# Patient Record
Sex: Female | Born: 1975 | Race: White | Hispanic: No | Marital: Married | Smoking: Former smoker
Health system: Southern US, Community
[De-identification: ages and names within clinical notes are randomized; demographics above are authoritative.]

## PROBLEM LIST (undated history)

## (undated) DIAGNOSIS — F32A Depression, unspecified: Secondary | ICD-10-CM

## (undated) DIAGNOSIS — K3184 Gastroparesis: Secondary | ICD-10-CM

## (undated) DIAGNOSIS — I2699 Other pulmonary embolism without acute cor pulmonale: Secondary | ICD-10-CM

## (undated) DIAGNOSIS — M810 Age-related osteoporosis without current pathological fracture: Secondary | ICD-10-CM

## (undated) DIAGNOSIS — S22000A Wedge compression fracture of unspecified thoracic vertebra, initial encounter for closed fracture: Secondary | ICD-10-CM

## (undated) DIAGNOSIS — F329 Major depressive disorder, single episode, unspecified: Secondary | ICD-10-CM

## (undated) DIAGNOSIS — F419 Anxiety disorder, unspecified: Secondary | ICD-10-CM

## (undated) DIAGNOSIS — J189 Pneumonia, unspecified organism: Secondary | ICD-10-CM

## (undated) DIAGNOSIS — G43909 Migraine, unspecified, not intractable, without status migrainosus: Secondary | ICD-10-CM

## (undated) DIAGNOSIS — N289 Disorder of kidney and ureter, unspecified: Secondary | ICD-10-CM

## (undated) DIAGNOSIS — D6861 Antiphospholipid syndrome: Secondary | ICD-10-CM

## (undated) HISTORY — PX: OTHER SURGICAL HISTORY: SHX169

## (undated) HISTORY — PX: CYST REMOVAL TRUNK: SHX6283

## (undated) HISTORY — PX: INSERT / REPLACE PERIPHERAL NEUROSTIMULATOR PULSE GENERATOR / RECEIVER: SUR715

## (undated) HISTORY — PX: EYE SURGERY: SHX253

## (undated) HISTORY — PX: ABDOMINAL HYSTERECTOMY: SHX81

## (undated) HISTORY — PX: TONSILLECTOMY: SUR1361

---

## 2009-11-18 ENCOUNTER — Ambulatory Visit: Payer: Self-pay | Admitting: Diagnostic Radiology

## 2009-11-18 ENCOUNTER — Emergency Department (HOSPITAL_BASED_OUTPATIENT_CLINIC_OR_DEPARTMENT_OTHER): Admission: EM | Admit: 2009-11-18 | Discharge: 2009-11-18 | Payer: Self-pay | Admitting: Emergency Medicine

## 2010-05-02 LAB — URINALYSIS, ROUTINE W REFLEX MICROSCOPIC
Glucose, UA: NEGATIVE mg/dL
Nitrite: NEGATIVE
Protein, ur: 100 mg/dL — AB
pH: 8 (ref 5.0–8.0)

## 2010-05-02 LAB — CULTURE, BLOOD (ROUTINE X 2): Culture: NO GROWTH

## 2010-05-02 LAB — DIFFERENTIAL
Basophils Absolute: 0 10*3/uL (ref 0.0–0.1)
Basophils Relative: 0 % (ref 0–1)
Eosinophils Absolute: 0.5 10*3/uL (ref 0.0–0.7)
Eosinophils Relative: 8 % — ABNORMAL HIGH (ref 0–5)
Lymphocytes Relative: 30 % (ref 12–46)
Lymphs Abs: 1.6 10*3/uL (ref 0.7–4.0)
Monocytes Absolute: 0.5 10*3/uL (ref 0.1–1.0)
Monocytes Relative: 9 % (ref 3–12)
Neutro Abs: 3 10*3/uL (ref 1.7–7.7)
Neutrophils Relative %: 53 % (ref 43–77)

## 2010-05-02 LAB — COMPREHENSIVE METABOLIC PANEL
Albumin: 4.1 g/dL (ref 3.5–5.2)
Alkaline Phosphatase: 85 U/L (ref 39–117)
BUN: 14 mg/dL (ref 6–23)
Calcium: 10.1 mg/dL (ref 8.4–10.5)
Creatinine, Ser: 0.9 mg/dL (ref 0.4–1.2)
Glucose, Bld: 86 mg/dL (ref 70–99)
Potassium: 4.3 mEq/L (ref 3.5–5.1)
Total Protein: 7.5 g/dL (ref 6.0–8.3)

## 2010-05-02 LAB — URINE CULTURE
Colony Count: NO GROWTH
Culture  Setup Time: 201110022005
Culture: NO GROWTH

## 2010-05-02 LAB — CBC
MCHC: 33.2 g/dL (ref 30.0–36.0)
MCV: 84.4 fL (ref 78.0–100.0)
Platelets: 259 10*3/uL (ref 150–400)
RDW: 12 % (ref 11.5–15.5)
WBC: 5.6 10*3/uL (ref 4.0–10.5)

## 2010-05-02 LAB — PROTIME-INR: INR: 0.89 (ref 0.00–1.49)

## 2010-05-02 LAB — URINE MICROSCOPIC-ADD ON

## 2010-05-22 DIAGNOSIS — S88119A Complete traumatic amputation at level between knee and ankle, unspecified lower leg, initial encounter: Secondary | ICD-10-CM

## 2010-05-22 DIAGNOSIS — G609 Hereditary and idiopathic neuropathy, unspecified: Secondary | ICD-10-CM

## 2010-05-22 DIAGNOSIS — I70269 Atherosclerosis of native arteries of extremities with gangrene, unspecified extremity: Secondary | ICD-10-CM

## 2010-05-22 DIAGNOSIS — G547 Phantom limb syndrome without pain: Secondary | ICD-10-CM

## 2010-08-29 ENCOUNTER — Other Ambulatory Visit: Payer: Self-pay | Admitting: Neurology

## 2010-08-29 DIAGNOSIS — H93A9 Pulsatile tinnitus, unspecified ear: Secondary | ICD-10-CM

## 2010-08-29 DIAGNOSIS — R519 Headache, unspecified: Secondary | ICD-10-CM

## 2010-09-02 ENCOUNTER — Ambulatory Visit
Admission: RE | Admit: 2010-09-02 | Discharge: 2010-09-02 | Disposition: A | Discharge status: Home / Self Care | Payer: BC Managed Care – PPO | Source: Ambulatory Visit | Attending: Neurology | Admitting: Neurology

## 2010-09-02 DIAGNOSIS — R519 Headache, unspecified: Secondary | ICD-10-CM

## 2010-09-02 DIAGNOSIS — H93A9 Pulsatile tinnitus, unspecified ear: Secondary | ICD-10-CM

## 2010-09-02 MED ORDER — GADOBENATE DIMEGLUMINE 529 MG/ML IV SOLN: 13.0000 mL | Freq: Once | INTRAVENOUS | Status: AC | PRN

## 2010-09-11 ENCOUNTER — Other Ambulatory Visit: Payer: Self-pay | Admitting: Neurology

## 2010-09-11 DIAGNOSIS — R519 Headache, unspecified: Secondary | ICD-10-CM

## 2010-09-13 ENCOUNTER — Other Ambulatory Visit: Payer: BC Managed Care – PPO

## 2010-09-13 ENCOUNTER — Ambulatory Visit
Admission: RE | Admit: 2010-09-13 | Discharge: 2010-09-13 | Disposition: A | Discharge status: Home / Self Care | Payer: BC Managed Care – PPO | Source: Ambulatory Visit | Attending: Neurology | Admitting: Neurology

## 2010-09-13 DIAGNOSIS — R519 Headache, unspecified: Secondary | ICD-10-CM

## 2010-09-13 MED ORDER — DIAZEPAM 2 MG PO TABS
10.0000 mg | ORAL_TABLET | Freq: Once | ORAL | Status: AC
  Administered 2010-09-13: 10 mg via ORAL

## 2010-09-13 NOTE — Progress Notes (Signed)
Patient's mother at bedside in recovery area.  Patient resting quietly without complaint.  Discharge instructions reviewed with both.  jkl

## 2011-04-01 ENCOUNTER — Telehealth: Payer: Self-pay | Admitting: Cardiology

## 2011-04-01 NOTE — Telephone Encounter (Signed)
Pt mother called and was very unhappy she had to wait not sure what she needed other than an appt and she did not give me a chance to take care of the problem she hung up on me

## 2011-04-01 NOTE — Telephone Encounter (Signed)
Follow-up:     Patient's mother called back wanting to consult with you about her daughter's pericardial cysts, she has chest pain and shortness of breath.  When offered to speak with our triage nurses she declined wanting to speak specifically with you.  Please call back.  Patient's mother is aware that you will call her back tomorrow.

## 2011-04-02 NOTE — Telephone Encounter (Signed)
Spoke with mother and she did get her answers.  Patient is seeking medical treatment, waiting on appointment with surgeon.

## 2011-12-02 ENCOUNTER — Emergency Department (HOSPITAL_BASED_OUTPATIENT_CLINIC_OR_DEPARTMENT_OTHER)
Admission: EM | Admit: 2011-12-02 | Discharge: 2011-12-02 | Disposition: A | Discharge status: Home / Self Care | Payer: BC Managed Care – PPO | Attending: Emergency Medicine | Admitting: Emergency Medicine

## 2011-12-02 ENCOUNTER — Encounter (HOSPITAL_BASED_OUTPATIENT_CLINIC_OR_DEPARTMENT_OTHER): Payer: Self-pay | Admitting: *Deleted

## 2011-12-02 DIAGNOSIS — Z86711 Personal history of pulmonary embolism: Secondary | ICD-10-CM | POA: Insufficient documentation

## 2011-12-02 DIAGNOSIS — F411 Generalized anxiety disorder: Secondary | ICD-10-CM | POA: Insufficient documentation

## 2011-12-02 DIAGNOSIS — R110 Nausea: Secondary | ICD-10-CM | POA: Insufficient documentation

## 2011-12-02 DIAGNOSIS — Z79899 Other long term (current) drug therapy: Secondary | ICD-10-CM | POA: Insufficient documentation

## 2011-12-02 DIAGNOSIS — F3289 Other specified depressive episodes: Secondary | ICD-10-CM | POA: Insufficient documentation

## 2011-12-02 DIAGNOSIS — F329 Major depressive disorder, single episode, unspecified: Secondary | ICD-10-CM | POA: Insufficient documentation

## 2011-12-02 DIAGNOSIS — H53149 Visual discomfort, unspecified: Secondary | ICD-10-CM | POA: Insufficient documentation

## 2011-12-02 DIAGNOSIS — G43909 Migraine, unspecified, not intractable, without status migrainosus: Secondary | ICD-10-CM | POA: Insufficient documentation

## 2011-12-02 HISTORY — DX: Depression, unspecified: F32.A

## 2011-12-02 HISTORY — DX: Migraine, unspecified, not intractable, without status migrainosus: G43.909

## 2011-12-02 HISTORY — DX: Anxiety disorder, unspecified: F41.9

## 2011-12-02 HISTORY — DX: Antiphospholipid syndrome: D68.61

## 2011-12-02 HISTORY — DX: Disorder of kidney and ureter, unspecified: N28.9

## 2011-12-02 HISTORY — DX: Major depressive disorder, single episode, unspecified: F32.9

## 2011-12-02 HISTORY — DX: Other pulmonary embolism without acute cor pulmonale: I26.99

## 2011-12-02 LAB — URINALYSIS, ROUTINE W REFLEX MICROSCOPIC
Glucose, UA: NEGATIVE mg/dL
Leukocytes, UA: NEGATIVE
Protein, ur: NEGATIVE mg/dL
Specific Gravity, Urine: 1.007 (ref 1.005–1.030)
Urobilinogen, UA: 0.2 mg/dL (ref 0.0–1.0)

## 2011-12-02 MED ORDER — ONDANSETRON HCL 4 MG/2ML IJ SOLN
4.0000 mg | Freq: Once | INTRAMUSCULAR | Status: DC
  Filled 2011-12-02: qty 2

## 2011-12-02 MED ORDER — FENTANYL CITRATE 0.05 MG/ML IJ SOLN
INTRAMUSCULAR | Status: AC
  Administered 2011-12-02: 50 ug via INTRAVENOUS
  Filled 2011-12-02: qty 2

## 2011-12-02 MED ORDER — DIPHENHYDRAMINE HCL 50 MG/ML IJ SOLN
25.0000 mg | Freq: Once | INTRAMUSCULAR | Status: AC
  Administered 2011-12-02: 25 mg via INTRAVENOUS
  Filled 2011-12-02: qty 1

## 2011-12-02 MED ORDER — METOCLOPRAMIDE HCL 5 MG/ML IJ SOLN
10.0000 mg | Freq: Once | INTRAMUSCULAR | Status: AC
  Administered 2011-12-02: 10 mg via INTRAVENOUS
  Filled 2011-12-02: qty 2

## 2011-12-02 MED ORDER — PROMETHAZINE HCL 25 MG/ML IJ SOLN
25.0000 mg | Freq: Once | INTRAMUSCULAR | Status: AC
  Administered 2011-12-02: 25 mg via INTRAVENOUS
  Filled 2011-12-02: qty 1

## 2011-12-02 MED ORDER — ONDANSETRON HCL 4 MG/2ML IJ SOLN
4.0000 mg | Freq: Once | INTRAMUSCULAR | Status: AC
  Administered 2011-12-02: 4 mg via INTRAVENOUS
  Filled 2011-12-02: qty 2

## 2011-12-02 MED ORDER — KETOROLAC TROMETHAMINE 30 MG/ML IJ SOLN
30.0000 mg | Freq: Once | INTRAMUSCULAR | Status: AC
  Administered 2011-12-02: 30 mg via INTRAVENOUS
  Filled 2011-12-02: qty 1

## 2011-12-02 MED ORDER — SUMATRIPTAN SUCCINATE 6 MG/0.5ML ~~LOC~~ SOLN: 6.0000 mg | Freq: Once | SUBCUTANEOUS | Status: DC

## 2011-12-02 MED ORDER — FENTANYL CITRATE 0.05 MG/ML IJ SOLN
50.0000 ug | Freq: Once | INTRAMUSCULAR | Status: AC
  Administered 2011-12-02: 50 ug via INTRAVENOUS

## 2011-12-02 MED ORDER — PROCHLORPERAZINE EDISYLATE 5 MG/ML IJ SOLN
10.0000 mg | Freq: Once | INTRAMUSCULAR | Status: DC
  Filled 2011-12-02: qty 2

## 2011-12-02 MED ORDER — FENTANYL CITRATE 0.05 MG/ML IJ SOLN
100.0000 ug | Freq: Once | INTRAMUSCULAR | Status: AC
  Administered 2011-12-02: 100 ug via INTRAVENOUS
  Filled 2011-12-02: qty 2

## 2011-12-02 MED ORDER — DEXAMETHASONE SODIUM PHOSPHATE 10 MG/ML IJ SOLN
10.0000 mg | Freq: Once | INTRAMUSCULAR | Status: AC
  Administered 2011-12-02: 10 mg via INTRAVENOUS
  Filled 2011-12-02: qty 1

## 2011-12-02 NOTE — ED Notes (Signed)
Migraine headache x 2 days. States she had ureteroscopy in [facility] yesterday for right flank pain that was negative.

## 2011-12-02 NOTE — ED Provider Notes (Signed)
History     CSN: [id number]  Arrival date & time 12/02/11  1801   First MD Initiated Contact with Patient 12/02/11 1816      Chief Complaint  Patient presents with  . Migraine    (Consider location/radiation/quality/duration/timing/severity/associated sxs/prior treatment) HPI Comments: Patient presents with complaint of migraine X 2 days. Patient states that she has had migraines for several years and that this presentation is like her other migraines. She has sought care from Neurology and the [facility]. Patient states that her headache is left sided, throbbing, 8/10, with associated nausea and photophobia. She has tried Imitrex, phenergan, klonopin, and percocet without relief. Denies fever or chills. Denies vomiting or diarrhea. Denies neck stiffness. Denies congestion or sinus pressure. Denies that this is the worse headache of her life.  The history is provided by the patient. No language interpreter was used.    Past Medical History  Diagnosis Date  . Migraine   . Anxiety   . Depression   . Renal disorder   . Anti-phospholipid antibody syndrome   . PE (pulmonary embolism)     Past Surgical History  Procedure Date  . Eye surgery   . Tonsillectomy   . Cesarean section   . Abdominal hysterectomy   . Utereral reconstruction     No family history on file.  History  Substance Use Topics  . Smoking status: Never Smoker   . Smokeless tobacco: Not on file  . Alcohol Use: No    OB History    Grav Para Term Preterm Abortions TAB SAB Ect Mult Living                  Review of Systems  Constitutional: Negative for fever and chills.  HENT: Negative for congestion and sinus pressure.   Eyes: Positive for photophobia. Negative for visual disturbance.  Gastrointestinal: Negative for nausea, vomiting, abdominal pain and diarrhea.  Neurological: Positive for headaches.    Allergies  Ciprofloxacin; Demerol; and Tequin  Home Medications   Current  Outpatient Rx  Name Route Sig Dispense Refill  . CIPROFLOXACIN HCL 500 MG PO TABS Oral Take 500 mg by mouth 2 (two) times daily.    . CELEXA PO Oral Take by mouth.    Marland Kitchen KLONOPIN PO Oral Take by mouth.    Marland Kitchen ADVIL PO Oral Take by mouth.    Marland Kitchen KEPPRA PO Oral Take by mouth.    . ONDANSETRON HCL 4 MG PO TABS Oral Take 4 mg by mouth every 8 (eight) hours as needed.    Marland Kitchen PERCOCET PO Oral Take by mouth.    . IMITREX PO Oral Take by mouth.    . TAMSULOSIN HCL 0.4 MG PO CAPS Oral Take by mouth.      BP 118/83  Pulse 78  Temp 98.6 F (37 C) (Oral)  Resp 20  SpO2 100%  Physical Exam  Nursing note and vitals reviewed. Constitutional: She is oriented to person, place, and time. She appears well-developed and well-nourished. No distress.  HENT:  Head: Normocephalic and atraumatic.  Mouth/Throat: Oropharynx is clear and moist.       No tenderness to palpation of the maxillary sinuses.  Eyes: Conjunctivae normal and EOM are normal. Pupils are equal, round, and reactive to light. No scleral icterus.  Neck: Normal range of motion. Neck supple.  Cardiovascular: Normal rate, regular rhythm and normal heart sounds.   Pulmonary/Chest: Effort normal and breath sounds normal.  Abdominal: Soft. Bowel sounds are normal.  There is no tenderness.  Neurological: She is alert and oriented to person, place, and time. No cranial nerve deficit. She exhibits normal muscle tone. Coordination normal.       Cranial nerves II-XII grossly intact. Good finger to nose. No pronator drift. Negative romberg.   Skin: Skin is warm and dry.    ED Course  Procedures (including critical care time)  Labs Reviewed - No data to display No results found.   1. Migraine       MDM  Patient presented with chief complaint of migraine like her other migraines. Patient given migraine cocktail with some improvement. Patient discharged with return precautions. Patient has no red flags for subarachnoid or  meningitis.       Pixie Casino, PA-C 12/02/11 2052

## 2011-12-02 NOTE — ED Notes (Signed)
Reglan charted as administered and actually not given.  Pt refused medication and states she had a dystonic reaction.

## 2011-12-02 NOTE — ED Provider Notes (Signed)
Medical screening examination/treatment/procedure(s) were performed by non-physician practitioner and as supervising physician I was immediately available for consultation/collaboration.    Nelia Shi, MD 12/02/11 2055

## 2013-02-12 ENCOUNTER — Emergency Department (HOSPITAL_COMMUNITY)
Admission: EM | Admit: 2013-02-12 | Discharge: 2013-02-12 | Disposition: A | Discharge status: Home / Self Care | Payer: BC Managed Care – PPO | Attending: Emergency Medicine | Admitting: Emergency Medicine

## 2013-02-12 ENCOUNTER — Encounter (HOSPITAL_COMMUNITY): Payer: Self-pay | Admitting: Emergency Medicine

## 2013-02-12 DIAGNOSIS — F3289 Other specified depressive episodes: Secondary | ICD-10-CM | POA: Insufficient documentation

## 2013-02-12 DIAGNOSIS — Z862 Personal history of diseases of the blood and blood-forming organs and certain disorders involving the immune mechanism: Secondary | ICD-10-CM | POA: Insufficient documentation

## 2013-02-12 DIAGNOSIS — G43909 Migraine, unspecified, not intractable, without status migrainosus: Secondary | ICD-10-CM | POA: Insufficient documentation

## 2013-02-12 DIAGNOSIS — Z792 Long term (current) use of antibiotics: Secondary | ICD-10-CM | POA: Insufficient documentation

## 2013-02-12 DIAGNOSIS — F411 Generalized anxiety disorder: Secondary | ICD-10-CM | POA: Insufficient documentation

## 2013-02-12 DIAGNOSIS — Z86711 Personal history of pulmonary embolism: Secondary | ICD-10-CM | POA: Insufficient documentation

## 2013-02-12 DIAGNOSIS — Z87448 Personal history of other diseases of urinary system: Secondary | ICD-10-CM | POA: Insufficient documentation

## 2013-02-12 DIAGNOSIS — Z79899 Other long term (current) drug therapy: Secondary | ICD-10-CM | POA: Insufficient documentation

## 2013-02-12 DIAGNOSIS — F329 Major depressive disorder, single episode, unspecified: Secondary | ICD-10-CM | POA: Insufficient documentation

## 2013-02-12 MED ORDER — KETOROLAC TROMETHAMINE 30 MG/ML IJ SOLN
30.0000 mg | Freq: Once | INTRAMUSCULAR | Status: AC
  Administered 2013-02-12: 30 mg via INTRAVENOUS
  Filled 2013-02-12: qty 1

## 2013-02-12 MED ORDER — DIPHENHYDRAMINE HCL 50 MG/ML IJ SOLN
25.0000 mg | Freq: Once | INTRAMUSCULAR | Status: AC
  Administered 2013-02-12: 25 mg via INTRAVENOUS
  Filled 2013-02-12: qty 1

## 2013-02-12 MED ORDER — HYDROMORPHONE HCL PF 1 MG/ML IJ SOLN
1.0000 mg | INTRAMUSCULAR | Status: AC
  Administered 2013-02-12: 1 mg via INTRAVENOUS
  Filled 2013-02-12: qty 1

## 2013-02-12 MED ORDER — PROMETHAZINE HCL 25 MG/ML IJ SOLN
25.0000 mg | Freq: Once | INTRAMUSCULAR | Status: DC
  Filled 2013-02-12: qty 1

## 2013-02-12 MED ORDER — ONDANSETRON HCL 4 MG/2ML IJ SOLN
4.0000 mg | Freq: Once | INTRAMUSCULAR | Status: AC
  Administered 2013-02-12: 4 mg via INTRAVENOUS
  Filled 2013-02-12: qty 2

## 2013-02-12 MED ORDER — DEXAMETHASONE SODIUM PHOSPHATE 10 MG/ML IJ SOLN
10.0000 mg | Freq: Once | INTRAMUSCULAR | Status: AC
  Administered 2013-02-12: 10 mg via INTRAVENOUS
  Filled 2013-02-12: qty 1

## 2013-02-12 NOTE — ED Provider Notes (Signed)
CSN: [id number]     Arrival date & time 02/12/13  0904 History   None   This chart was scribed for Trixie Dredge PA-C, a non-physician practitioner working with Ethelda Chick, MD by Lewanda Rife, ED Scribe. This patient was seen in room TR11C/TR11C and the patient's care was started at 9:56 AM     Chief Complaint  Patient presents with  . Migraine   (Consider location/radiation/quality/duration/timing/severity/associated sxs/prior Treatment) The history is provided by the patient and medical records. No language interpreter was used.   HPI Comments: Jacqueline Nelson is a 37 y.o. female who presents to the Emergency Department with PMHx of neural stimulator (placed 11/2012) for migraines complaining of constant unchanged left sided frontal headache onset 3 days. Describes pain as 9/10 in severity and throbbing. Reports associated photophobia, and nausea. Reports symptoms are aggravated by light and alleviated by nothing. Denies associated abdominal pain, fever, head trauma, emesis, numbness/weakness in arms or legs, diplopia, blurry vision, change in gait, and back pain.  Reports taking Imitrex, Relpax, phenergan, Ambien, and percocet last night and this morning with no relief of symptoms. States this is typical of her migraine pattern, last headache this bad was several months ago.    Past Medical History  Diagnosis Date  . Migraine   . Anxiety   . Depression   . Renal disorder   . Anti-phospholipid antibody syndrome   . PE (pulmonary embolism)    Past Surgical History  Procedure Laterality Date  . Eye surgery    . Tonsillectomy    . Cesarean section    . Abdominal hysterectomy    . Utereral reconstruction     No family history on file. History  Substance Use Topics  . Smoking status: Never Smoker   . Smokeless tobacco: Not on file  . Alcohol Use: No   OB History   Grav Para Term Preterm Abortions TAB SAB Ect Mult Living                 Review of Systems  Constitutional:  Negative for fever.  Eyes: Positive for photophobia. Negative for visual disturbance.  Neurological: Positive for headaches.  Psychiatric/Behavioral: Negative for confusion.   A complete 10 system review of systems was obtained and all systems are negative except as noted in the HPI and PMHx.    Allergies  Ciprofloxacin; Demerol; Tequin; and Reglan  Home Medications   Current Outpatient Rx  Name  Route  Sig  Dispense  Refill  . ciprofloxacin (CIPRO) 500 MG tablet   Oral   Take 500 mg by mouth 2 (two) times daily.         . Citalopram Hydrobromide (CELEXA PO)   Oral   Take by mouth.         . ClonazePAM (KLONOPIN PO)   Oral   Take by mouth.         . Ibuprofen (ADVIL PO)   Oral   Take by mouth.         . LevETIRAcetam (KEPPRA PO)   Oral   Take by mouth.         . ondansetron (ZOFRAN) 4 MG tablet   Oral   Take 4 mg by mouth every 8 (eight) hours as needed.         . Oxycodone-Acetaminophen (PERCOCET PO)   Oral   Take by mouth.         . SUMAtriptan Succinate (IMITREX PO)   Oral   Take by mouth.         Marland Kitchen  Tamsulosin HCl (FLOMAX) 0.4 MG CAPS   Oral   Take by mouth.          BP 126/87  Pulse 111  Temp(Src) 97.8 F (36.6 C) (Oral)  Resp 18  Wt 169 lb 2 oz (76.715 kg)  SpO2 97% Physical Exam  Nursing note and vitals reviewed. Constitutional: She appears well-developed and well-nourished. No distress.  HENT:  Head: Normocephalic and atraumatic.  Neck: Neck supple.  Pulmonary/Chest: Effort normal.  Neurological: She is alert.  CN II-XII intact, EOMs intact, no pronator drift, grip strengths equal bilaterally; strength 5/5 in all extremities, sensation intact in all extremities; finger to nose, heel to shin, rapid alternating movements normal; gait is normal.     Skin: She is not diaphoretic.  Psychiatric: She has a normal mood and affect. Her behavior is normal.    ED Course  Procedures   COORDINATION OF CARE:  Nursing notes  reviewed. Vital signs reviewed. Initial pt interview and examination performed.    11:22 AM Pt states headache is improving and over left eye instead of entire left head.   12:43 PM Improving and requesting 1 more dose of pain medication before discharge Treatment plan initiated: Medications  promethazine (PHENERGAN) injection 25 mg (25 mg Intravenous Pending 02/12/13 1056)  HYDROmorphone (DILAUDID) injection 1 mg (not administered)  diphenhydrAMINE (BENADRYL) injection 25 mg (not administered)  diphenhydrAMINE (BENADRYL) injection 25 mg (25 mg Intravenous Given 02/12/13 1016)  ketorolac (TORADOL) 30 MG/ML injection 30 mg (30 mg Intravenous Given 02/12/13 1016)  dexamethasone (DECADRON) injection 10 mg (10 mg Intravenous Given 02/12/13 1016)  HYDROmorphone (DILAUDID) injection 1 mg (1 mg Intravenous Given 02/12/13 1218)  ondansetron (ZOFRAN) injection 4 mg (4 mg Intravenous Given 02/12/13 1218)     Initial diagnostic testing ordered.    Labs Review Labs Reviewed - No data to display Imaging Review No results found.  EKG Interpretation   None       MDM   1. Migraine     Pt with typical migraine symptoms not relieved by home meds.  No red flags.  Pt given IVF, migraine cocktail and dilaudid.  D/C home with neuro follow up in [place] - pt already has upcoming appointment.  Discussed findings, treatment, and follow up  with patient.  Pt given return precautions.  Pt verbalizes understanding and agrees with plan.      I personally performed the services described in this documentation, which was scribed in my presence. The recorded information has been reviewed and is accurate.    Trixie Dredge, PA-C 02/12/13 1249

## 2013-02-12 NOTE — ED Provider Notes (Signed)
Medical screening examination/treatment/procedure(s) were performed by non-physician practitioner and as supervising physician I was immediately available for consultation/collaboration.  EKG Interpretation   None        Santiago Stenzel K Linker, MD 02/12/13 1342 

## 2013-02-12 NOTE — ED Notes (Signed)
Ppt. Stated, i started having a migraine headache on Thursday. I took like imitrex and percocet this morning and no better.

## 2013-08-12 ENCOUNTER — Encounter (HOSPITAL_BASED_OUTPATIENT_CLINIC_OR_DEPARTMENT_OTHER): Payer: Self-pay | Admitting: Emergency Medicine

## 2013-08-12 ENCOUNTER — Emergency Department (HOSPITAL_BASED_OUTPATIENT_CLINIC_OR_DEPARTMENT_OTHER)
Admission: EM | Admit: 2013-08-12 | Discharge: 2013-08-12 | Disposition: A | Discharge status: Home / Self Care | Payer: BC Managed Care – PPO | Attending: Emergency Medicine | Admitting: Emergency Medicine

## 2013-08-12 DIAGNOSIS — G43009 Migraine without aura, not intractable, without status migrainosus: Secondary | ICD-10-CM

## 2013-08-12 DIAGNOSIS — F3289 Other specified depressive episodes: Secondary | ICD-10-CM | POA: Insufficient documentation

## 2013-08-12 DIAGNOSIS — F329 Major depressive disorder, single episode, unspecified: Secondary | ICD-10-CM | POA: Insufficient documentation

## 2013-08-12 DIAGNOSIS — Z7982 Long term (current) use of aspirin: Secondary | ICD-10-CM | POA: Insufficient documentation

## 2013-08-12 DIAGNOSIS — Z862 Personal history of diseases of the blood and blood-forming organs and certain disorders involving the immune mechanism: Secondary | ICD-10-CM | POA: Insufficient documentation

## 2013-08-12 DIAGNOSIS — Z79899 Other long term (current) drug therapy: Secondary | ICD-10-CM | POA: Insufficient documentation

## 2013-08-12 DIAGNOSIS — F411 Generalized anxiety disorder: Secondary | ICD-10-CM | POA: Insufficient documentation

## 2013-08-12 DIAGNOSIS — G43909 Migraine, unspecified, not intractable, without status migrainosus: Secondary | ICD-10-CM | POA: Insufficient documentation

## 2013-08-12 DIAGNOSIS — Z8742 Personal history of other diseases of the female genital tract: Secondary | ICD-10-CM | POA: Insufficient documentation

## 2013-08-12 DIAGNOSIS — Z86711 Personal history of pulmonary embolism: Secondary | ICD-10-CM | POA: Insufficient documentation

## 2013-08-12 MED ORDER — DIPHENHYDRAMINE HCL 50 MG/ML IJ SOLN
12.5000 mg | Freq: Once | INTRAMUSCULAR | Status: AC
  Administered 2013-08-12: 12.5 mg via INTRAVENOUS
  Filled 2013-08-12: qty 1

## 2013-08-12 MED ORDER — HYDROMORPHONE HCL PF 1 MG/ML IJ SOLN
0.5000 mg | Freq: Once | INTRAMUSCULAR | Status: AC
  Administered 2013-08-12: 0.5 mg via INTRAVENOUS
  Filled 2013-08-12: qty 1

## 2013-08-12 MED ORDER — LORAZEPAM 2 MG/ML IJ SOLN
1.0000 mg | Freq: Once | INTRAMUSCULAR | Status: AC
  Administered 2013-08-12: 1 mg via INTRAVENOUS
  Filled 2013-08-12: qty 1

## 2013-08-12 MED ORDER — KETOROLAC TROMETHAMINE 30 MG/ML IJ SOLN
30.0000 mg | Freq: Once | INTRAMUSCULAR | Status: AC
  Administered 2013-08-12: 30 mg via INTRAVENOUS
  Filled 2013-08-12: qty 1

## 2013-08-12 MED ORDER — PROMETHAZINE HCL 25 MG/ML IJ SOLN
12.5000 mg | Freq: Once | INTRAMUSCULAR | Status: AC
  Administered 2013-08-12: 12.5 mg via INTRAVENOUS
  Filled 2013-08-12: qty 1

## 2013-08-12 MED ORDER — ONDANSETRON HCL 4 MG/2ML IJ SOLN
4.0000 mg | Freq: Once | INTRAMUSCULAR | Status: AC
  Administered 2013-08-12: 4 mg via INTRAVENOUS
  Filled 2013-08-12: qty 2

## 2013-08-12 NOTE — Discharge Instructions (Signed)
Migraine Headache A migraine headache is very bad, throbbing pain on one or both sides of your head. Talk to your doctor about what things may bring on (trigger) your migraine headaches. HOME CARE  Only take medicines as told by your doctor.  Lie down in a dark, quiet room when you have a migraine.  Keep a journal to find out if certain things bring on migraine headaches. For example, write down:  What you eat and drink.  How much sleep you get.  Any change to your diet or medicines.  Lessen how much alcohol you drink.  Quit smoking if you smoke.  Get enough sleep.  Lessen any stress in your life.  Keep lights dim if bright lights bother you or make your migraines worse. GET HELP RIGHT AWAY IF:   Your migraine becomes really bad.  You have a fever.  You have a stiff neck.  You have trouble seeing.  Your muscles are weak, or you lose muscle control.  You lose your balance or have trouble walking.  You feel like you will pass out (faint), or you pass out.  You have really bad symptoms that are different than your first symptoms. MAKE SURE YOU:   Understand these instructions.  Will watch your condition.  Will get help right away if you are not doing well or get worse. Document Released: 11/13/2007 Document Revised: 04/28/2011 Document Reviewed: 10/11/2012 [facility] Patient Information 2015 [place], [place]. This information is not intended to replace advice given to you by your health care provider. Make sure you discuss any questions you have with your health care provider.

## 2013-08-12 NOTE — ED Provider Notes (Signed)
Medical screening examination/treatment/procedure(s) were performed by non-physician practitioner and as supervising physician I was immediately available for consultation/collaboration.   EKG Interpretation None        Malvin Johns, MD 08/12/13 2117

## 2013-08-12 NOTE — ED Provider Notes (Signed)
CSN: [id number]     Arrival date & time 08/12/13  1512 History   First MD Initiated Contact with Patient 08/12/13 1534     Chief Complaint  Patient presents with  . Migraine     (Consider location/radiation/quality/duration/timing/severity/associated sxs/prior Treatment) Patient is a 38 y.o. female presenting with migraines. The history is provided by the patient. No language interpreter was used.  Migraine This is a new problem. The current episode started yesterday. Associated symptoms include headaches, nausea and vomiting. Pertinent negatives include no chills or fever. Associated symptoms comments: Headache typical of migraine history, behind eyes bilaterally. No fever. She has photophobia, nausea with vomiting. She took her regular medications without relief. No symptoms that are different from usual headache..    Past Medical History  Diagnosis Date  . Migraine   . Anxiety   . Depression   . Renal disorder   . Anti-phospholipid antibody syndrome   . PE (pulmonary embolism)    Past Surgical History  Procedure Laterality Date  . Eye surgery    . Tonsillectomy    . Cesarean section    . Abdominal hysterectomy    . Utereral reconstruction     No family history on file. History  Substance Use Topics  . Smoking status: Never Smoker   . Smokeless tobacco: Not on file  . Alcohol Use: No   OB History   Grav Para Term Preterm Abortions TAB SAB Ect Mult Living                 Review of Systems  Constitutional: Negative for fever and chills.  Eyes: Positive for photophobia.  Respiratory: Negative.   Cardiovascular: Negative.   Gastrointestinal: Positive for nausea and vomiting.  Musculoskeletal: Negative.   Skin: Negative.   Neurological: Positive for headaches.      Allergies  Ciprofloxacin; Demerol; Tequin; Compazine; Imitrex; and Reglan  Home Medications   Prior to Admission medications   Medication Sig Start Date End Date Taking? Authorizing Provider   aspirin 325 MG tablet Take 325 mg by mouth daily.    Historical Provider, MD  Cholecalciferol (VITAMIN D) 2000 UNITS CAPS Take 2,000 Units by mouth daily.    Historical Provider, MD  clonazePAM (KLONOPIN) 2 MG tablet Take 1-2 mg by mouth 2 (two) times daily as needed for anxiety.    Historical Provider, MD  desipramine (NOPRAMIN) 10 MG tablet Take 20 mg by mouth at bedtime.    Historical Provider, MD  diphenhydrAMINE (BENADRYL) 25 MG tablet Take 25-50 mg by mouth every 6 (six) hours as needed for allergies.    Historical Provider, MD  eletriptan (RELPAX) 40 MG tablet Take 40 mg by mouth 2 (two) times daily as needed for migraine or headache. One tablet by mouth at onset of headache. May repeat in 2 hours if headache persists or recurs.    Historical Provider, MD  escitalopram (LEXAPRO) 20 MG tablet Take 30 mg by mouth daily.    Historical Provider, MD  ondansetron (ZOFRAN) 4 MG tablet Take 4 mg by mouth every 8 (eight) hours as needed for vomiting.     Historical Provider, MD  oxyCODONE-acetaminophen (PERCOCET) 10-325 MG per tablet Take 1 tablet by mouth every 6 (six) hours as needed (for headache).    Historical Provider, MD  pantoprazole (PROTONIX) 40 MG tablet Take 40 mg by mouth every morning.    Historical Provider, MD  promethazine (PHENERGAN) 12.5 MG tablet Take 12.5-25 mg by mouth every 8 (eight) hours as needed for  nausea or vomiting.    Historical Provider, MD  tiZANidine (ZANAFLEX) 2 MG tablet Take 2 mg by mouth 2 (two) times daily as needed for muscle spasms.    Historical Provider, MD  zolpidem (AMBIEN) 10 MG tablet Take 10 mg by mouth at bedtime.    Historical Provider, MD  zonisamide (ZONEGRAN) 100 MG capsule Take 100 mg by mouth daily.    Historical Provider, MD   BP 122/78  Pulse 97  Temp(Src) 98.7 F (37.1 C) (Oral)  Resp 18  Ht 5' 3"  (1.6 m)  Wt 168 lb (76.204 kg)  BMI 29.77 kg/m2  SpO2 100% Physical Exam  Constitutional: She is oriented to person, place, and time. She  appears well-developed and well-nourished.  HENT:  Head: Normocephalic.  Eyes: Pupils are equal, round, and reactive to light.  Neck: Normal range of motion. Neck supple.  Cardiovascular: Normal rate and regular rhythm.   Pulmonary/Chest: Effort normal and breath sounds normal.  Abdominal: Soft. Bowel sounds are normal. There is no tenderness. There is no rebound and no guarding.  Musculoskeletal: Normal range of motion.  Neurological: She is alert and oriented to person, place, and time. She has normal strength and normal reflexes. No sensory deficit. She displays a negative Romberg sign. Coordination normal.  Speech clear and focused. No abnormalities of coordination. CN's 3-12 grossly intact.   Skin: Skin is warm and dry.  Psychiatric: She has a normal mood and affect.    ED Course  Procedures (including critical care time) Labs Review Labs Reviewed - No data to display  Imaging Review No results found.   EKG Interpretation None      MDM   Final diagnoses:  None    1. Migraine headache  Multiple medications given for pain with some relief. She reports she is ready for discharge home. Encouraged to follow up with her doctor for recheck.     Dewaine Oats, PA-C 08/12/13 1749

## 2013-08-12 NOTE — ED Notes (Signed)
Pt c/o migraine that started last night. Pt reports no relief from benadryl, phenergan, percocet. Pt's pain MD tried to do a block for her but it did not help either.

## 2013-11-26 ENCOUNTER — Emergency Department (HOSPITAL_BASED_OUTPATIENT_CLINIC_OR_DEPARTMENT_OTHER)
Admission: EM | Admit: 2013-11-26 | Discharge: 2013-11-26 | Disposition: A | Discharge status: Home / Self Care | Payer: BC Managed Care – PPO | Attending: Emergency Medicine | Admitting: Emergency Medicine

## 2013-11-26 ENCOUNTER — Emergency Department (HOSPITAL_COMMUNITY): Payer: BC Managed Care – PPO

## 2013-11-26 ENCOUNTER — Emergency Department (HOSPITAL_BASED_OUTPATIENT_CLINIC_OR_DEPARTMENT_OTHER): Payer: BC Managed Care – PPO

## 2013-11-26 ENCOUNTER — Encounter (HOSPITAL_BASED_OUTPATIENT_CLINIC_OR_DEPARTMENT_OTHER): Payer: Self-pay | Admitting: Emergency Medicine

## 2013-11-26 DIAGNOSIS — J189 Pneumonia, unspecified organism: Secondary | ICD-10-CM | POA: Diagnosis not present

## 2013-11-26 DIAGNOSIS — R079 Chest pain, unspecified: Secondary | ICD-10-CM

## 2013-11-26 DIAGNOSIS — R0602 Shortness of breath: Secondary | ICD-10-CM

## 2013-11-26 DIAGNOSIS — R9431 Abnormal electrocardiogram [ECG] [EKG]: Secondary | ICD-10-CM

## 2013-11-26 HISTORY — DX: Pneumonia, unspecified organism: J18.9

## 2013-11-26 HISTORY — DX: Gastroparesis: K31.84

## 2013-11-26 HISTORY — DX: Wedge compression fracture of unspecified thoracic vertebra, initial encounter for closed fracture: S22.000A

## 2013-11-26 HISTORY — DX: Age-related osteoporosis without current pathological fracture: M81.0

## 2013-11-26 LAB — CBC WITH DIFFERENTIAL/PLATELET
BASOS PCT: 0 % (ref 0–1)
Basophils Absolute: 0 10*3/uL (ref 0.0–0.1)
Eosinophils Absolute: 0.2 10*3/uL (ref 0.0–0.7)
Eosinophils Relative: 3 % (ref 0–5)
HCT: 36.9 % (ref 36.0–46.0)
Hemoglobin: 12 g/dL (ref 12.0–15.0)
Lymphocytes Relative: 46 % (ref 12–46)
Lymphs Abs: 3 10*3/uL (ref 0.7–4.0)
MCH: 27.7 pg (ref 26.0–34.0)
MCHC: 32.5 g/dL (ref 30.0–36.0)
MCV: 85.2 fL (ref 78.0–100.0)
Monocytes Absolute: 0.6 10*3/uL (ref 0.1–1.0)
Monocytes Relative: 9 % (ref 3–12)
NEUTROS PCT: 42 % — AB (ref 43–77)
Neutro Abs: 2.8 10*3/uL (ref 1.7–7.7)
PLATELETS: 250 10*3/uL (ref 150–400)
RBC: 4.33 MIL/uL (ref 3.87–5.11)
RDW: 13.6 % (ref 11.5–15.5)
WBC: 6.7 10*3/uL (ref 4.0–10.5)

## 2013-11-26 LAB — URINALYSIS, ROUTINE W REFLEX MICROSCOPIC
BILIRUBIN URINE: NEGATIVE
GLUCOSE, UA: NEGATIVE mg/dL
HGB URINE DIPSTICK: NEGATIVE
Ketones, ur: NEGATIVE mg/dL
Leukocytes, UA: NEGATIVE
Nitrite: NEGATIVE
PH: 7 (ref 5.0–8.0)
Protein, ur: NEGATIVE mg/dL
SPECIFIC GRAVITY, URINE: 1.015 (ref 1.005–1.030)
Urobilinogen, UA: 0.2 mg/dL (ref 0.0–1.0)

## 2013-11-26 LAB — COMPREHENSIVE METABOLIC PANEL
ALBUMIN: 3.4 g/dL — AB (ref 3.5–5.2)
ALK PHOS: 61 U/L (ref 39–117)
ALT: 21 U/L (ref 0–35)
ANION GAP: 12 (ref 5–15)
AST: 18 U/L (ref 0–37)
BUN: 20 mg/dL (ref 6–23)
CO2: 21 mEq/L (ref 19–32)
Calcium: 9 mg/dL (ref 8.4–10.5)
Chloride: 109 mEq/L (ref 96–112)
Creatinine, Ser: 1 mg/dL (ref 0.50–1.10)
GFR calc Af Amer: 82 mL/min — ABNORMAL LOW (ref >90)
GFR calc non Af Amer: 71 mL/min — ABNORMAL LOW (ref >90)
Glucose, Bld: 96 mg/dL (ref 70–99)
POTASSIUM: 4.2 meq/L (ref 3.7–5.3)
SODIUM: 142 meq/L (ref 137–147)
TOTAL PROTEIN: 6.5 g/dL (ref 6.0–8.3)
Total Bilirubin: <0.2 mg/dL — ABNORMAL LOW (ref 0.3–1.2)

## 2013-11-26 LAB — TROPONIN I: Troponin I: <0.3 ng/mL (ref <0.30)

## 2013-11-26 LAB — D-DIMER, QUANTITATIVE (NOT AT ARMC): D DIMER QUANT: 0.33 ug{FEU}/mL (ref 0.00–0.48)

## 2013-11-26 LAB — PRO B NATRIURETIC PEPTIDE: Pro B Natriuretic peptide (BNP): 94.8 pg/mL (ref 0–125)

## 2013-11-26 MED ORDER — ONDANSETRON HCL 4 MG/2ML IJ SOLN
4.0000 mg | Freq: Once | INTRAMUSCULAR | Status: AC
  Administered 2013-11-26: 4 mg via INTRAVENOUS
  Filled 2013-11-26: qty 2

## 2013-11-26 MED ORDER — KETOROLAC TROMETHAMINE 30 MG/ML IJ SOLN
30.0000 mg | Freq: Once | INTRAMUSCULAR | Status: AC
  Administered 2013-11-26: 30 mg via INTRAVENOUS
  Filled 2013-11-26: qty 1

## 2013-11-26 MED ORDER — HYDROMORPHONE HCL 1 MG/ML IJ SOLN
1.0000 mg | Freq: Once | INTRAMUSCULAR | Status: AC
  Administered 2013-11-26: 1 mg via INTRAVENOUS
  Filled 2013-11-26: qty 1

## 2013-11-26 MED ORDER — PROMETHAZINE HCL 25 MG/ML IJ SOLN
12.5000 mg | Freq: Once | INTRAMUSCULAR | Status: AC
  Administered 2013-11-26: 12.5 mg via INTRAVENOUS
  Filled 2013-11-26: qty 1

## 2013-11-26 MED ORDER — DIPHENHYDRAMINE HCL 50 MG/ML IJ SOLN
25.0000 mg | Freq: Once | INTRAMUSCULAR | Status: AC
  Administered 2013-11-26: 25 mg via INTRAVENOUS
  Filled 2013-11-26: qty 1

## 2013-11-26 MED ORDER — AMOXICILLIN-POT CLAVULANATE 875-125 MG PO TABS: 1.0000 | ORAL_TABLET | Freq: Two times a day (BID) | ORAL | Status: AC

## 2013-11-26 MED ORDER — SODIUM CHLORIDE 0.9 % IV BOLUS (SEPSIS): 1000.0000 mL | Freq: Once | INTRAVENOUS | Status: DC

## 2013-11-26 MED ORDER — HYDROMORPHONE HCL 1 MG/ML IJ SOLN
1.0000 mg | Freq: Once | INTRAMUSCULAR | Status: AC
Start: 2013-11-26 — End: 2013-11-26
  Administered 2013-11-26: 1 mg via INTRAVENOUS
  Filled 2013-11-26: qty 1

## 2013-11-26 MED ORDER — IOHEXOL 350 MG/ML SOLN
100.0000 mL | Freq: Once | INTRAVENOUS | Status: AC | PRN
  Administered 2013-11-26: 100 mL via INTRAVENOUS

## 2013-11-26 MED ORDER — FENTANYL CITRATE 0.05 MG/ML IJ SOLN
50.0000 ug | Freq: Once | INTRAMUSCULAR | Status: AC
  Administered 2013-11-26: 50 ug via INTRAVENOUS
  Filled 2013-11-26: qty 2

## 2013-11-26 NOTE — ED Notes (Signed)
Pt reports she was dx with pneumonia 10 days ago and has one more dose of levaquin- States sx has improved except for generalized achiness across chest and bilateral ribs and joint pain- Also c/o some dysuria- reports she vomited earlier in the week

## 2013-11-26 NOTE — ED Provider Notes (Signed)
CSN: [id number]     Arrival date & time 11/26/13  1037 History   First MD Initiated Contact with Patient 11/26/13 1123     Chief Complaint  Patient presents with  . Chest Pain    Patient is a 38 y.o. female presenting with shortness of breath. The history is provided by the patient.  Shortness of Breath Severity:  Moderate Onset quality:  Gradual Duration:  2 weeks Timing:  Constant Progression:  Unchanged Chronicity:  Recurrent Relieved by:  Nothing Worsened by:  Exertion Associated symptoms: chest pain   Associated symptoms: no abdominal pain, no cough, no fever, no headaches, no neck pain, no rash, no sore throat and no vomiting    38 year old Caucasian female with history of antiphospholipid antibody syndrome and PE who presents as a transfer from outside facility for IV access to obtain a PE scan. Patient originally presented with chest pain and shortness of breath. She is currently not anticoagulated, and has not been for over a year. Multiple attempts to obtain IV access were failed and the patient was transferred here to obtain either a CTA if adequate access was gained, or a VQ scan. However the patient has left lung pneumonia so the utility of a VQ scan is limited at this time. Patient was given a prescription for Augmentin and instructed to discontinue Levaquin for prolonged QTc.  Past Medical History  Diagnosis Date  . Migraine   . Anxiety   . Depression   . Renal disorder   . Anti-phospholipid antibody syndrome   . PE (pulmonary embolism)   . Pneumonia   . Osteoporosis   . Thoracic compression fracture   . Gastroparesis    Past Surgical History  Procedure Laterality Date  . Eye surgery    . Tonsillectomy    . Cesarean section    . Abdominal hysterectomy    . Utereral reconstruction    . Cyst removal trunk      pericardial cyst removal  . Insert / replace peripheral neurostimulator pulse generator / receiver     No family history on file. History   Substance Use Topics  . Smoking status: Former Research scientist (life sciences)  . Smokeless tobacco: Never Used  . Alcohol Use: No     Comment: RARE   OB History   Grav Para Term Preterm Abortions TAB SAB Ect Mult Living                 Review of Systems  Constitutional: Negative for fever.  HENT: Negative for rhinorrhea and sore throat.   Eyes: Negative for visual disturbance.  Respiratory: Positive for shortness of breath. Negative for cough and chest tightness.   Cardiovascular: Positive for chest pain. Negative for palpitations.  Gastrointestinal: Negative for nausea, vomiting, abdominal pain and constipation.  Genitourinary: Negative for dysuria and hematuria.  Musculoskeletal: Negative for back pain and neck pain.  Skin: Negative for rash.  Neurological: Negative for dizziness and headaches.  Psychiatric/Behavioral: Negative for confusion.  All other systems reviewed and are negative.     Allergies  Ciprofloxacin; Demerol; Linseed oil; Oatmeal; Tequin; Compazine; Imitrex; Morphine and related; Pineapple; and Reglan  Home Medications   Prior to Admission medications   Medication Sig Start Date End Date Taking? Authorizing Provider  albuterol (PROAIR HFA) 108 (90 BASE) MCG/ACT inhaler Inhale 2 puffs into the lungs every 6 (six) hours as needed for wheezing or shortness of breath.   Yes Historical Provider, MD  busPIRone (BUSPAR) 15 MG tablet Take 15 mg by  mouth 2 (two) times daily.   Yes Historical Provider, MD  clonazePAM (KLONOPIN) 1 MG tablet Take 1-2 mg by mouth 2 (two) times daily as needed for anxiety.   Yes Historical Provider, MD  Coenzyme Q10 (COQ-10) 100 MG CAPS Take 100 mg by mouth daily.    Yes Historical Provider, MD  diphenhydrAMINE (BENADRYL) 25 MG tablet Take 25-50 mg by mouth 2 (two) times daily as needed for itching or allergies.    Yes Historical Provider, MD  diphenhydrAMINE (BENADRYL) 50 MG/ML injection Inject 50 mg into the muscle every 8 (eight) hours as needed  (migraines).   Yes Historical Provider, MD  erythromycin (E-MYCIN) 250 MG tablet Take 250 mg by mouth 2 (two) times daily. continuous 09/26/13  Yes Historical Provider, MD  frovatriptan (FROVA) 2.5 MG tablet Take 2.5 mg by mouth as needed for migraine. If recurs, may repeat after 2 hours. Max of 3 tabs in 24 hours.   Yes Historical Provider, MD  gabapentin (NEURONTIN) 300 MG capsule Take 900 mg by mouth at bedtime.   Yes Historical Provider, MD  ibuprofen (ADVIL,MOTRIN) 200 MG tablet Take 600 mg by mouth every 8 (eight) hours as needed (pain).    Yes Historical Provider, MD  ketorolac (TORADOL) 30 MG/ML injection Inject 30 mg into the muscle every 6 (six) hours as needed (migraines).  08/22/13  Yes Historical Provider, MD  Levomilnacipran HCl ER (FETZIMA) 120 MG CP24 Take 120 mg by mouth daily.    Yes Historical Provider, MD  ondansetron (ZOFRAN) 8 MG tablet Take 8 mg by mouth every 8 (eight) hours as needed for nausea or vomiting.    Yes Historical Provider, MD  oxyCODONE-acetaminophen (PERCOCET) 10-325 MG per tablet Take 1 tablet by mouth 2 (two) times daily. Scheduled for back pain   Yes Historical Provider, MD  pantoprazole (PROTONIX) 40 MG tablet Take 40 mg by mouth daily.    Yes Historical Provider, MD  promethazine (PHENERGAN) 25 MG tablet Take 25 mg by mouth every 8 (eight) hours as needed for nausea or vomiting.   Yes Historical Provider, MD  propranolol ER (INDERAL LA) 120 MG 24 hr capsule Take 120 mg by mouth daily.   Yes Historical Provider, MD  tiZANidine (ZANAFLEX) 4 MG tablet Take 2-12 mg by mouth every 8 (eight) hours as needed (migraines).    Yes Historical Provider, MD  Topiramate ER (TROKENDI XR) 100 MG CP24 Take 100 mg by mouth at bedtime.    Yes Historical Provider, MD  Vitamin D, Ergocalciferol, (DRISDOL) 50000 UNITS CAPS capsule Take 50,000 Units by mouth every 30 (thirty) days. On the 1st of each month   Yes Historical Provider, MD  zolpidem (AMBIEN) 10 MG tablet Take 10 mg by  mouth at bedtime.   Yes Historical Provider, MD  amoxicillin-clavulanate (AUGMENTIN) 875-125 MG per tablet Take 1 tablet by mouth every 12 (twelve) hours. 11/26/13   Ezequiel Essex, MD   BP 114/76  Pulse 83  Temp(Src) 98.6 F (37 C) (Oral)  Resp 16  Ht 5' 2"  (1.575 m)  Wt 170 lb (77.111 kg)  BMI 31.09 kg/m2  SpO2 98% Physical Exam  Constitutional: She is oriented to person, place, and time. She appears well-developed and well-nourished. No distress.  HENT:  Head: Normocephalic and atraumatic.  Mouth/Throat: Oropharynx is clear and moist.  Malar rash  Eyes: EOM are normal. Pupils are equal, round, and reactive to light.  Neck: Neck supple. No JVD present.  Cardiovascular: Normal rate, regular rhythm, normal heart sounds  and intact distal pulses.  Exam reveals no gallop.   No murmur heard. Pulmonary/Chest: Effort normal and breath sounds normal. She has no wheezes. She has no rales.  Abdominal: Soft. She exhibits no distension. There is no tenderness.  Musculoskeletal: Normal range of motion. She exhibits no tenderness.  Neurological: She is alert and oriented to person, place, and time. No cranial nerve deficit. She exhibits normal muscle tone.  Skin: Skin is warm and dry. No rash noted.  Psychiatric: Her behavior is normal.    ED Course  Procedures  Angiocath insertion Performed by: Gustavus Bryant  Consent: Verbal consent obtained. Risks and benefits: risks, benefits and alternatives were discussed Time out: Immediately prior to procedure a "time out" was called to verify the correct patient, procedure, equipment, support staff and site/side marked as required.  Preparation: Patient was prepped and draped in the usual sterile fashion.  Vein Location: right upper arm  Ultrasound Guided  Gauge: 20  Normal blood return and flush without difficulty Patient tolerance: Patient tolerated the procedure well with no immediate complications.  Labs Review Labs Reviewed  CBC  WITH DIFFERENTIAL - Abnormal; Notable for the following:    Neutrophils Relative % 42 (*)    All other components within normal limits  COMPREHENSIVE METABOLIC PANEL - Abnormal; Notable for the following:    Albumin 3.4 (*)    Total Bilirubin <0.2 (*)    GFR calc non Af Amer 71 (*)    GFR calc Af Amer 82 (*)    All other components within normal limits  URINALYSIS, ROUTINE W REFLEX MICROSCOPIC  TROPONIN I  PRO B NATRIURETIC PEPTIDE  D-DIMER, QUANTITATIVE    Imaging Review Dg Chest 2 View  11/26/2013   CLINICAL DATA:  Patient diagnosed with community acquired pneumonia approximately 10 days ago for which she has been treated. This was performed at [facility] in [place], [place]. Patient states that she still has generalized chest pain and mid back pain. Prior history of pulmonary emboli and current history of anti-phospholipid antibody syndrome.  EXAM: CHEST  2 VIEW  COMPARISON:  11/18/2009.  FINDINGS: Cardiomediastinal silhouette unremarkable, unchanged. Minimal patchy airspace opacity in the lingula. Lungs otherwise clear. No localized airspace consolidation. No pleural effusions. No pneumothorax. Normal pulmonary vascularity. Prior augmentation of what I believe is the T8 vertebral body, with old appearing compression fractures of T7 and T10. Subcutaneous wire leads in the back related to a neural stimulator device.  IMPRESSION: Mild patchy pneumonia involving the lingula.   Electronically Signed   By: Evangeline Dakin M.D.   On: 11/26/2013 12:23   Ct Angio Chest Pe W/cm &/or Wo Cm  11/26/2013   CLINICAL DATA:  Generalized chest achiness, with bilateral flank pain. Dysuria. One episode of vomiting. Recently diagnosed with pneumonia, status post treatment. Initial encounter.  EXAM: CT ANGIOGRAPHY CHEST WITH CONTRAST  TECHNIQUE: Multidetector CT imaging of the chest was performed using the standard protocol during bolus administration of intravenous contrast. Multiplanar  CT image reconstructions and MIPs were obtained to evaluate the vascular anatomy.  CONTRAST:  185m OMNIPAQUE IOHEXOL 350 MG/ML SOLN  COMPARISON:  Chest radiograph performed earlier today at 12:25 p.m.  FINDINGS: There is no evidence of pulmonary embolus.  Minimal bilateral patchy opacities are noted, within the left lingula and at the lung bases. This could reflect residual pneumonia, given recent diagnosis. There is no evidence of pleural effusion or pneumothorax. No masses are identified; no abnormal focal contrast enhancement is seen.  The  pulmonary outflow tract is borderline normal in size. The mediastinum is unremarkable in appearance. No mediastinal lymphadenopathy is seen. No pericardial effusion is identified. The great vessels are grossly unremarkable in appearance. No axillary lymphadenopathy is seen. The visualized portions of the thyroid gland are unremarkable in appearance.  The visualized portions of the liver and spleen are unremarkable. A metallic device is noted in the soft tissues at the left mid back.  No acute osseous abnormalities are seen. The patient is status post vertebroplasty at vertebral body T8, with mild chronic compression deformities also noted at T7 and T4.  Review of the MIP images confirms the above findings.  IMPRESSION: 1. No evidence of pulmonary embolus. 2. Minimal bilateral patchy opacities, within the left lingula and at the lung bases. This could reflect minimal residual pneumonia, given the recent diagnosis. 3. Status post vertebroplasty at T8, with mild chronic compression deformities at T4 and T7.   Electronically Signed   By: Garald Balding M.D.   On: 11/26/2013 21:24     EKG Interpretation   Date/Time:  Saturday November 26 2013 11:47:51 EDT Ventricular Rate:  94 PR Interval:  158 QRS Duration: 88 QT Interval:  568 QTC Calculation: 710 R Axis:   87 Text Interpretation:   Critical Test Result: Long QTc Normal sinus  rhythm Prolonged QT Abnormal ECG No  previous ECGs available Confirmed by  Wyvonnia Dusky  MD, STEPHEN [phone]) on 11/26/2013 11:50:19 AM      MDM   Final diagnoses:  Chest pain  CAP (community acquired pneumonia)  SOB (shortness of breath)  Prolonged Q-T interval on ECG    38 year old female presents with chest pain and shortness of breath and needing IV access to obtain a PE scan. I was able place a 20-gauge in her right upper arm under ultrasound guidance. Patient will receive her PET scan. CTA chest scan negative for PE. Patient instructed to followup with her PCP in 2-3 days. Patient instructed to discontinue Levaquin and continue on her Augmentin. Patient stable for discharge.  Case discussed with Dr. Audie Pinto.   Gustavus Bryant, MD 11/26/13 2149

## 2013-11-26 NOTE — ED Notes (Addendum)
Attempt x 2 to obtain labs by foot draw unsuccessfulAlyse Low, RN aware

## 2013-11-26 NOTE — ED Notes (Signed)
Patient transported to CT 

## 2013-11-26 NOTE — ED Notes (Signed)
Waiting for creatine labs to be drawn that are ordered for CT angio chest.  Thanks

## 2013-11-26 NOTE — ED Notes (Signed)
IV attempts x3 without success, bard ultra sound used.  RN aware

## 2013-11-26 NOTE — Discharge Instructions (Signed)

## 2013-11-26 NOTE — ED Notes (Signed)
Left radial artery puncture for labs.  + allen's prior to procedure, stick x 1.

## 2013-11-26 NOTE — ED Notes (Signed)
MD Rancour notified unable to place PIV. Pt in xray at this time

## 2013-11-26 NOTE — ED Provider Notes (Signed)
CSN: [id number]     Arrival date & time 11/26/13  1037 History  This chart was scribed for Ezequiel Essex, MD by Ludger Nutting, ED Scribe. This patient was seen in room MH07/MH07 and the patient's care was started 11:28 AM.    Chief Complaint  Patient presents with  . Chest Pain   The history is provided by the patient. No language interpreter was used.    HPI Comments: Jacqueline Nelson is a 38 y.o. female with past medical history of PE and anti-phospholipid antibody syndrome who presents to the Emergency Department complaining of constant, worsened generalized chest heaviness and mid back pain that has been ongoing for more than 10 days. She states she was seen in [place] for this chest pain, SOB on exertion, fevers and was diagnosed with an atypical pneumonia. She states she has a history of a prior PE and was concerned this was the case. She states imaging ruled out PE but showed pneumonia. She was started on Levaquin at that time and she has one remaining dose. She reports having generalized myalgias now along with nausea and a few episodes of vomiting. She denies cough, congestion, leg swelling, diaphoresis. She is not currently taking anti-coagulants.   Past Medical History  Diagnosis Date  . Migraine   . Anxiety   . Depression   . Renal disorder   . Anti-phospholipid antibody syndrome   . PE (pulmonary embolism)   . Pneumonia   . Osteoporosis   . Thoracic compression fracture   . Gastroparesis    Past Surgical History  Procedure Laterality Date  . Eye surgery    . Tonsillectomy    . Cesarean section    . Abdominal hysterectomy    . Utereral reconstruction    . Cyst removal trunk      pericardial cyst removal  . Insert / replace peripheral neurostimulator pulse generator / receiver     No family history on file. History  Substance Use Topics  . Smoking status: Former Research scientist (life sciences)  . Smokeless tobacco: Never Used  . Alcohol Use: No     Comment: RARE   OB History   Grav Para  Term Preterm Abortions TAB SAB Ect Mult Living                 Review of Systems  A complete 10 system review of systems was obtained and all systems are negative except as noted in the HPI and PMH.    Allergies  Ciprofloxacin; Demerol; Tequin; Compazine; Imitrex; Morphine and related; and Reglan  Home Medications   Prior to Admission medications   Medication Sig Start Date End Date Taking? Authorizing Provider  busPIRone (BUSPAR) 15 MG tablet Take 15 mg by mouth 2 (two) times daily.   Yes Historical Provider, MD  clonazePAM (KLONOPIN) 2 MG tablet Take 1-2 mg by mouth 2 (two) times daily as needed for anxiety.   Yes Historical Provider, MD  diphenhydrAMINE (BENADRYL) 25 MG tablet Take 25-50 mg by mouth every 6 (six) hours as needed for allergies.   Yes Historical Provider, MD  Erythromycin Ethylsuccinate (E.E.S. 200 PO) Take 1 tablet by mouth 2 (two) times daily.   Yes Historical Provider, MD  frovatriptan (FROVA) 2.5 MG tablet Take 2.5 mg by mouth as needed for migraine. If recurs, may repeat after 2 hours. Max of 3 tabs in 24 hours.   Yes Historical Provider, MD  gabapentin (NEURONTIN) 300 MG capsule Take 900 mg by mouth at bedtime.   Yes Historical  Provider, MD  Levomilnacipran HCl ER (FETZIMA) 120 MG CP24 Take 1 tablet by mouth daily.   Yes Historical Provider, MD  ondansetron (ZOFRAN-ODT) 8 MG disintegrating tablet Take 8 mg by mouth every 8 (eight) hours as needed for nausea or vomiting.   Yes Historical Provider, MD  oxyCODONE-acetaminophen (PERCOCET) 10-325 MG per tablet Take 1 tablet by mouth 2 (two) times daily.    Yes Historical Provider, MD  pantoprazole (PROTONIX) 40 MG tablet Take 40 mg by mouth every morning.   Yes Historical Provider, MD  promethazine (PHENERGAN) 12.5 MG tablet Take 12.5-25 mg by mouth every 8 (eight) hours as needed for nausea or vomiting.   Yes Historical Provider, MD  propranolol (INNOPRAN XL) 120 MG 24 hr capsule Take 120 mg by mouth at bedtime.   Yes  Historical Provider, MD  propranolol ER (INDERAL LA) 120 MG 24 hr capsule Take 120 mg by mouth daily.   Yes Historical Provider, MD  tiZANidine (ZANAFLEX) 2 MG tablet Take 2 mg by mouth 2 (two) times daily as needed for muscle spasms.   Yes Historical Provider, MD  Topiramate ER 100 MG CP24 Take 1 tablet by mouth at bedtime.   Yes Historical Provider, MD  Vitamin D, Ergocalciferol, (DRISDOL) 50000 UNITS CAPS capsule Take 50,000 Units by mouth every 30 (thirty) days.   Yes Historical Provider, MD  zolpidem (AMBIEN) 10 MG tablet Take 10 mg by mouth at bedtime.   Yes Historical Provider, MD  amoxicillin-clavulanate (AUGMENTIN) 875-125 MG per tablet Take 1 tablet by mouth every 12 (twelve) hours. 11/26/13   Ezequiel Essex, MD  aspirin 325 MG tablet Take 325 mg by mouth daily.    Historical Provider, MD  Cholecalciferol (VITAMIN D) 2000 UNITS CAPS Take 2,000 Units by mouth daily.    Historical Provider, MD  desipramine (NOPRAMIN) 10 MG tablet Take 20 mg by mouth at bedtime.    Historical Provider, MD  eletriptan (RELPAX) 40 MG tablet Take 40 mg by mouth 2 (two) times daily as needed for migraine or headache. One tablet by mouth at onset of headache. May repeat in 2 hours if headache persists or recurs.    Historical Provider, MD  escitalopram (LEXAPRO) 20 MG tablet Take 30 mg by mouth daily.    Historical Provider, MD  ondansetron (ZOFRAN) 4 MG tablet Take 4 mg by mouth every 8 (eight) hours as needed for vomiting.     Historical Provider, MD  zonisamide (ZONEGRAN) 100 MG capsule Take 100 mg by mouth daily.    Historical Provider, MD   BP 122/63  Pulse 84  Temp(Src) 98.7 F (37.1 C) (Oral)  Resp 20  Ht 5' 2"  (1.575 m)  Wt 170 lb (77.111 kg)  BMI 31.09 kg/m2  SpO2 99% Physical Exam  Nursing note and vitals reviewed. Constitutional: She is oriented to person, place, and time. She appears well-developed and well-nourished. No distress.  HENT:  Head: Normocephalic and atraumatic.  Mouth/Throat:  Oropharynx is clear and moist. No oropharyngeal exudate.  Eyes: Conjunctivae and EOM are normal. Pupils are equal, round, and reactive to light.  Neck: Normal range of motion. Neck supple.  No meningismus.  Cardiovascular: Normal rate, regular rhythm, normal heart sounds and intact distal pulses.   No murmur heard. Pulmonary/Chest: Effort normal and breath sounds normal. No respiratory distress. She has no wheezes. She has no rales. She exhibits tenderness.  Abdominal: Soft. There is no tenderness. There is no rebound and no guarding.  Musculoskeletal: Normal range of motion. She  exhibits tenderness. She exhibits no edema.  Diffuse paraspinal tenderness  Neurological: She is alert and oriented to person, place, and time. No cranial nerve deficit. She exhibits normal muscle tone. Coordination normal.  No ataxia on finger to nose bilaterally. No pronator drift. 5/5 strength throughout. CN 2-12 intact. Negative Romberg. Equal grip strength. Sensation intact. Gait is normal.   Skin: Skin is warm. No rash noted.  Psychiatric: She has a normal mood and affect. Her behavior is normal.    ED Course  Procedures (including critical care time)  DIAGNOSTIC STUDIES: Oxygen Saturation is 100% on RA, normal by my interpretation.    COORDINATION OF CARE: 11:39 AM Discussed treatment plan with pt at bedside and pt agreed to plan.   Labs Review Labs Reviewed  CBC WITH DIFFERENTIAL - Abnormal; Notable for the following:    Neutrophils Relative % 42 (*)    All other components within normal limits  COMPREHENSIVE METABOLIC PANEL - Abnormal; Notable for the following:    Albumin 3.4 (*)    Total Bilirubin <0.2 (*)    GFR calc non Af Amer 71 (*)    GFR calc Af Amer 82 (*)    All other components within normal limits  URINALYSIS, ROUTINE W REFLEX MICROSCOPIC  TROPONIN I  PRO B NATRIURETIC PEPTIDE  D-DIMER, QUANTITATIVE    Imaging Review Dg Chest 2 View  11/26/2013   CLINICAL DATA:  Patient  diagnosed with community acquired pneumonia approximately 10 days ago for which she has been treated. This was performed at [facility] in [place], [place]. Patient states that she still has generalized chest pain and mid back pain. Prior history of pulmonary emboli and current history of anti-phospholipid antibody syndrome.  EXAM: CHEST  2 VIEW  COMPARISON:  11/18/2009.  FINDINGS: Cardiomediastinal silhouette unremarkable, unchanged. Minimal patchy airspace opacity in the lingula. Lungs otherwise clear. No localized airspace consolidation. No pleural effusions. No pneumothorax. Normal pulmonary vascularity. Prior augmentation of what I believe is the T8 vertebral body, with old appearing compression fractures of T7 and T10. Subcutaneous wire leads in the back related to a neural stimulator device.  IMPRESSION: Mild patchy pneumonia involving the lingula.   Electronically Signed   By: Evangeline Dakin M.D.   On: 11/26/2013 12:23     EKG Interpretation  Date/Time:  Saturday November 26 2013 11:47:51 EDT Ventricular Rate:  94 PR Interval:  158 QRS Duration: 88 QT Interval:  568 QTC Calculation: 710 R Axis:   87 Text Interpretation:   Critical Test Result: Long QTc Normal sinus rhythm Prolonged QT Abnormal ECG No previous ECGs available Confirmed by Wyvonnia Dusky  MD, [place] [phone]) on 11/26/2013 11:50:19 AM      MDM   Final diagnoses:  Chest pain  CAP (community acquired pneumonia)   patient with ongoing chest pain and body aches after being diagnosed with pneumonia 10 days ago. She has one more dose of Levaquin. She endorses body aches, rib pain and joint pain. No fever. No cough. History of PE  Chest x-ray shows residual lingular pneumonia. No hypoxia or increased work of breathing.  QT has normalized on repeat EKG. Seems that prolonged QT was being misread and QT is closer to 400. Advised patient to stop taking Levaquin.  Unable to obtain adequate IV access for CT  angiogram Multiple providers have tried IV access with [place] and unable to thread catheters. Small 24-gauge obtained in hand only. Discussed with patient that CT angiogram cannot be performed without adequate IV  access. VQ scan maybe less ideal in setting of abnormal chest x-ray but other options limited. Discussed with Dr. Audie Pinto at [facility] accepts patient for VQ scan or CT if large bore IV access able to be obtained.  After PE ruled out, plan to discharge patient with Augmentin for residual pneumonia. Patient instructed to stop Levaquin in setting of questionable prolonged QT.  BP 122/63  Pulse 84  Temp(Src) 98.7 F (37.1 C) (Oral)  Resp 20  Ht 5' 2"  (1.575 m)  Wt 170 lb (77.111 kg)  BMI 31.09 kg/m2  SpO2 99%  I personally performed the services described in this documentation, which was scribed in my presence. The recorded information has been reviewed and is accurate.   Ezequiel Essex, MD 11/26/13 [id number]

## 2013-11-26 NOTE — ED Notes (Signed)
Dr Wyvonnia Dusky at bedside to attempt placement of IV for CT- Attempted right arm unsucessful

## 2013-11-27 NOTE — ED Provider Notes (Signed)
I saw and evaluated the patient, reviewed the resident's note and I agree with the findings and plan.   .Face to face Exam:  General:  Awake HEENT:  Atraumatic Resp:  Normal effort Abd:  Nondistended Neuro:No focal weakness  Seen by Dr. Wyvonnia Dusky and sent to [facility] for CTA to R/O PE.  No PE on CTA.  No complaints of syncope.  Hx of anxiety.  Will stop levaquin because of QT prolongation.  Dot Lanes, MD 11/27/13 1106

## 2014-08-31 ENCOUNTER — Encounter (HOSPITAL_COMMUNITY): Payer: Self-pay | Admitting: Neurology

## 2014-08-31 ENCOUNTER — Emergency Department (HOSPITAL_COMMUNITY)
Admission: EM | Admit: 2014-08-31 | Discharge: 2014-08-31 | Disposition: A | Discharge status: Home / Self Care | Payer: BLUE CROSS/BLUE SHIELD | Attending: Emergency Medicine | Admitting: Emergency Medicine

## 2014-08-31 DIAGNOSIS — Z87891 Personal history of nicotine dependence: Secondary | ICD-10-CM | POA: Insufficient documentation

## 2014-08-31 DIAGNOSIS — M810 Age-related osteoporosis without current pathological fracture: Secondary | ICD-10-CM | POA: Insufficient documentation

## 2014-08-31 DIAGNOSIS — Z86711 Personal history of pulmonary embolism: Secondary | ICD-10-CM | POA: Diagnosis not present

## 2014-08-31 DIAGNOSIS — Z8701 Personal history of pneumonia (recurrent): Secondary | ICD-10-CM | POA: Diagnosis not present

## 2014-08-31 DIAGNOSIS — F329 Major depressive disorder, single episode, unspecified: Secondary | ICD-10-CM | POA: Diagnosis not present

## 2014-08-31 DIAGNOSIS — Z8719 Personal history of other diseases of the digestive system: Secondary | ICD-10-CM | POA: Insufficient documentation

## 2014-08-31 DIAGNOSIS — G43909 Migraine, unspecified, not intractable, without status migrainosus: Secondary | ICD-10-CM | POA: Diagnosis not present

## 2014-08-31 DIAGNOSIS — Z87448 Personal history of other diseases of urinary system: Secondary | ICD-10-CM | POA: Diagnosis not present

## 2014-08-31 DIAGNOSIS — Z8781 Personal history of (healed) traumatic fracture: Secondary | ICD-10-CM | POA: Insufficient documentation

## 2014-08-31 DIAGNOSIS — F419 Anxiety disorder, unspecified: Secondary | ICD-10-CM | POA: Diagnosis not present

## 2014-08-31 DIAGNOSIS — R51 Headache: Secondary | ICD-10-CM

## 2014-08-31 DIAGNOSIS — Z862 Personal history of diseases of the blood and blood-forming organs and certain disorders involving the immune mechanism: Secondary | ICD-10-CM | POA: Insufficient documentation

## 2014-08-31 DIAGNOSIS — R519 Headache, unspecified: Secondary | ICD-10-CM

## 2014-08-31 MED ORDER — HALOPERIDOL LACTATE 5 MG/ML IJ SOLN
2.0000 mg | Freq: Once | INTRAMUSCULAR | Status: AC
  Administered 2014-08-31: 2 mg via INTRAVENOUS
  Filled 2014-08-31: qty 1

## 2014-08-31 MED ORDER — KETOROLAC TROMETHAMINE 15 MG/ML IJ SOLN
15.0000 mg | Freq: Once | INTRAMUSCULAR | Status: AC
  Administered 2014-08-31: 15 mg via INTRAVENOUS
  Filled 2014-08-31: qty 1

## 2014-08-31 MED ORDER — MAGNESIUM SULFATE 2 GM/50ML IV SOLN
2.0000 g | Freq: Once | INTRAVENOUS | Status: AC
  Administered 2014-08-31: 2 g via INTRAVENOUS
  Filled 2014-08-31: qty 50

## 2014-08-31 MED ORDER — PROMETHAZINE HCL 25 MG/ML IJ SOLN
25.0000 mg | Freq: Once | INTRAMUSCULAR | Status: AC
  Administered 2014-08-31: 25 mg via INTRAVENOUS
  Filled 2014-08-31: qty 1

## 2014-08-31 MED ORDER — DIPHENHYDRAMINE HCL 50 MG/ML IJ SOLN
25.0000 mg | Freq: Once | INTRAMUSCULAR | Status: AC
  Administered 2014-08-31: 25 mg via INTRAVENOUS
  Filled 2014-08-31: qty 1

## 2014-08-31 MED ORDER — SODIUM CHLORIDE 0.9 % IV BOLUS (SEPSIS)
1000.0000 mL | Freq: Once | INTRAVENOUS | Status: AC
  Administered 2014-08-31: 1000 mL via INTRAVENOUS

## 2014-08-31 NOTE — ED Provider Notes (Signed)
CSN: [id number]     Arrival date & time 08/31/14  0910 History   First MD Initiated Contact with Patient 08/31/14 0935     Chief Complaint  Patient presents with  . Migraine     (Consider location/radiation/quality/duration/timing/severity/associated sxs/prior Treatment) HPI   39yF with headache. Gradual onset last night. Hx of what she calls migraines. Has had similar headaches previously. Has used IM toradol with minimal improvement. No fever or chills. Denies trauma. Associates photophobia and nausea. No change in visual acuity. No acute numbness, tingling or loss of strength.   Past Medical History  Diagnosis Date  . Migraine   . Anxiety   . Depression   . Renal disorder   . Anti-phospholipid antibody syndrome   . PE (pulmonary embolism)   . Pneumonia   . Osteoporosis   . Thoracic compression fracture   . Gastroparesis    Past Surgical History  Procedure Laterality Date  . Eye surgery    . Tonsillectomy    . Cesarean section    . Abdominal hysterectomy    . Utereral reconstruction    . Cyst removal trunk      pericardial cyst removal  . Insert / replace peripheral neurostimulator pulse generator / receiver     No family history on file. History  Substance Use Topics  . Smoking status: Former Research scientist (life sciences)  . Smokeless tobacco: Never Used  . Alcohol Use: No     Comment: RARE   OB History    No data available     Review of Systems  All systems reviewed and negative, other than as noted in HPI.   Allergies  Ciprofloxacin; Demerol; Dhea; Imitrex; Linseed oil; Oat; Oatmeal; Tequin; Bactrim; Compazine; Morphine and related; Pineapple; and Reglan  Home Medications   Prior to Admission medications   Medication Sig Start Date End Date Taking? Authorizing Provider  albuterol (PROAIR HFA) 108 (90 BASE) MCG/ACT inhaler Inhale 2 puffs into the lungs every 6 (six) hours as needed for wheezing or shortness of breath.   Yes Historical Provider, MD  busPIRone (BUSPAR) 15 MG  tablet Take 15 mg by mouth 2 (two) times daily.   Yes Historical Provider, MD  clonazePAM (KLONOPIN) 1 MG tablet Take 1-2 mg by mouth 2 (two) times daily as needed for anxiety.   Yes Historical Provider, MD  diphenhydrAMINE (BENADRYL) 25 MG tablet Take 25-50 mg by mouth 2 (two) times daily as needed for itching or allergies.    Yes Historical Provider, MD  frovatriptan (FROVA) 2.5 MG tablet Take 2.5 mg by mouth as needed for migraine. If recurs, may repeat after 2 hours. Max of 3 tabs in 24 hours.   Yes Historical Provider, MD  gabapentin (NEURONTIN) 300 MG capsule Take 900 mg by mouth at bedtime.   Yes Historical Provider, MD  ibuprofen (ADVIL,MOTRIN) 200 MG tablet Take 600 mg by mouth every 8 (eight) hours as needed (pain).    Yes Historical Provider, MD  ketorolac (TORADOL) 30 MG/ML injection Inject 30 mg into the muscle every 6 (six) hours as needed (migraines).  08/22/13  Yes Historical Provider, MD  L-Methylfolate-Algae (DEPLIN 15) 15-90.314 MG CAPS Take 15 mg by mouth every morning. 08/10/14  Yes Historical Provider, MD  Levomilnacipran HCl ER (FETZIMA) 120 MG CP24 Take 120 mg by mouth daily.    Yes Historical Provider, MD  lisinopril (PRINIVIL,ZESTRIL) 10 MG tablet Take 10 mg by mouth daily. 08/18/14  Yes Historical Provider, MD  ondansetron (ZOFRAN) 8 MG tablet Take 8  mg by mouth every 8 (eight) hours as needed for nausea or vomiting.    Yes Historical Provider, MD  oxyCODONE-acetaminophen (PERCOCET) 10-325 MG per tablet Take 1 tablet by mouth 2 (two) times daily. Scheduled for back pain   Yes Historical Provider, MD  promethazine (PHENERGAN) 25 MG tablet Take 25 mg by mouth every 8 (eight) hours as needed for nausea or vomiting.   Yes Historical Provider, MD  propranolol ER (INDERAL LA) 120 MG 24 hr capsule Take 120 mg by mouth daily.   Yes Historical Provider, MD  tiZANidine (ZANAFLEX) 4 MG tablet Take 2-12 mg by mouth every 8 (eight) hours as needed (migraines).    Yes Historical Provider, MD   Vitamin D, Ergocalciferol, (DRISDOL) 50000 UNITS CAPS capsule Take 50,000 Units by mouth every 30 (thirty) days. On the 1st of each month   Yes Historical Provider, MD  zolpidem (AMBIEN CR) 12.5 MG CR tablet Take 12.5 mg by mouth at bedtime. 08/01/14  Yes Historical Provider, MD  amoxicillin-clavulanate (AUGMENTIN) 875-125 MG per tablet Take 1 tablet by mouth every 12 (twelve) hours. Patient not taking: Reported on 08/31/2014 11/26/13   Ezequiel Essex, MD  Coenzyme Q10 (COQ-10) 100 MG CAPS Take 100 mg by mouth daily.     Historical Provider, MD   BP 112/74 mmHg  Pulse 82  Temp(Src) 98.4 F (36.9 C) (Oral)  Resp 16  Ht 5' 2"  (1.575 m)  Wt 167 lb (75.751 kg)  BMI 30.54 kg/m2  SpO2 100% Physical Exam  Constitutional: She is oriented to person, place, and time. She appears well-developed and well-nourished. No distress.  HENT:  Head: Normocephalic and atraumatic.  Eyes: Conjunctivae and EOM are normal. Pupils are equal, round, and reactive to light. Right eye exhibits no discharge. Left eye exhibits no discharge.  Neck: Neck supple.  No nuchal rigidity  Cardiovascular: Normal rate, regular rhythm and normal heart sounds.  Exam reveals no gallop and no friction rub.   No murmur heard. Pulmonary/Chest: Effort normal and breath sounds normal. No respiratory distress.  Abdominal: Soft. She exhibits no distension. There is no tenderness.  Musculoskeletal: She exhibits no edema or tenderness.  Neurological: She is alert and oriented to person, place, and time. No cranial nerve deficit. She exhibits normal muscle tone. Coordination normal.  Speech clear. Content appropriate. Follows commands. Strength 5/5 b/l u/l ext.   Skin: Skin is warm and dry.  Psychiatric: She has a normal mood and affect. Her behavior is normal. Thought content normal.  Nursing note and vitals reviewed.   ED Course  Procedures (including critical care time) Labs Review Labs Reviewed - No data to display  Imaging  Review No results found.   EKG Interpretation None      MDM   Final diagnoses:  Nonintractable headache, unspecified chronicity pattern, unspecified headache type    39yF with headache. Suspect primary HA. Consider emergent secondary causes such as bleed, infectious or mass but doubt. There is no history of trauma. Pt has a nonfocal neurological exam. Afebrile and neck supple. No use of blood thinning medication. Consider ocular etiology such as acute angle closure glaucoma but doubt. Pt denies acute change in visual acuity and eye exam unremarkable. Doubt temporal arteritis given age, no temporal tenderness and temporal artery pulsations palpable. Doubt CO poisoning. No contacts with similar symptoms. Doubt venous thrombosis. Doubt carotid or vertebral arteries dissection. Symptoms improved with meds. Feel that can be safely discharged, but strict return precautions discussed. Outpt fu.  Virgel Manifold, MD 09/06/14 [phone]

## 2014-08-31 NOTE — Discharge Instructions (Signed)

## 2014-08-31 NOTE — ED Notes (Signed)
Pt reports migraine, has hx of same. Also nauseated and dizzy. Started last night.
